# Patient Record
Sex: Female | Born: 1940 | Race: White | Hispanic: No | Smoking: Never smoker
Health system: Southern US, Community
[De-identification: ages and names within clinical notes are randomized; demographics above are authoritative.]

## PROBLEM LIST (undated history)

## (undated) DIAGNOSIS — I10 Essential (primary) hypertension: Secondary | ICD-10-CM

## (undated) DIAGNOSIS — C50911 Malignant neoplasm of unspecified site of right female breast: Secondary | ICD-10-CM

---

## 2012-12-03 DIAGNOSIS — E559 Vitamin D deficiency, unspecified: Secondary | ICD-10-CM | POA: Insufficient documentation

## 2013-06-24 DIAGNOSIS — E039 Hypothyroidism, unspecified: Secondary | ICD-10-CM | POA: Insufficient documentation

## 2014-06-15 DIAGNOSIS — H2510 Age-related nuclear cataract, unspecified eye: Secondary | ICD-10-CM | POA: Insufficient documentation

## 2016-10-20 DIAGNOSIS — R7309 Other abnormal glucose: Secondary | ICD-10-CM | POA: Insufficient documentation

## 2016-10-20 DIAGNOSIS — K529 Noninfective gastroenteritis and colitis, unspecified: Secondary | ICD-10-CM | POA: Insufficient documentation

## 2016-10-20 DIAGNOSIS — M353 Polymyalgia rheumatica: Secondary | ICD-10-CM | POA: Insufficient documentation

## 2016-10-20 DIAGNOSIS — E785 Hyperlipidemia, unspecified: Secondary | ICD-10-CM | POA: Insufficient documentation

## 2016-10-20 DIAGNOSIS — M8580 Other specified disorders of bone density and structure, unspecified site: Secondary | ICD-10-CM | POA: Insufficient documentation

## 2017-06-27 DIAGNOSIS — G8929 Other chronic pain: Secondary | ICD-10-CM | POA: Insufficient documentation

## 2018-06-27 DIAGNOSIS — M25551 Pain in right hip: Secondary | ICD-10-CM | POA: Insufficient documentation

## 2018-07-24 DIAGNOSIS — M19071 Primary osteoarthritis, right ankle and foot: Secondary | ICD-10-CM | POA: Insufficient documentation

## 2018-09-16 DIAGNOSIS — I4891 Unspecified atrial fibrillation: Secondary | ICD-10-CM | POA: Insufficient documentation

## 2018-09-24 DIAGNOSIS — K219 Gastro-esophageal reflux disease without esophagitis: Secondary | ICD-10-CM | POA: Insufficient documentation

## 2018-09-24 DIAGNOSIS — Z853 Personal history of malignant neoplasm of breast: Secondary | ICD-10-CM | POA: Insufficient documentation

## 2018-09-24 DIAGNOSIS — Z9013 Acquired absence of bilateral breasts and nipples: Secondary | ICD-10-CM | POA: Insufficient documentation

## 2018-09-24 DIAGNOSIS — I7121 Aneurysm of the ascending aorta, without rupture: Secondary | ICD-10-CM | POA: Insufficient documentation

## 2019-04-14 DIAGNOSIS — R002 Palpitations: Secondary | ICD-10-CM | POA: Insufficient documentation

## 2019-04-16 DIAGNOSIS — L570 Actinic keratosis: Secondary | ICD-10-CM | POA: Insufficient documentation

## 2019-04-16 DIAGNOSIS — L821 Other seborrheic keratosis: Secondary | ICD-10-CM | POA: Insufficient documentation

## 2019-04-16 DIAGNOSIS — D225 Melanocytic nevi of trunk: Secondary | ICD-10-CM | POA: Insufficient documentation

## 2019-06-27 DIAGNOSIS — H02539 Eyelid retraction unspecified eye, unspecified lid: Secondary | ICD-10-CM | POA: Insufficient documentation

## 2019-08-12 DIAGNOSIS — H02409 Unspecified ptosis of unspecified eyelid: Secondary | ICD-10-CM | POA: Insufficient documentation

## 2019-08-12 DIAGNOSIS — M6289 Other specified disorders of muscle: Secondary | ICD-10-CM | POA: Insufficient documentation

## 2019-11-29 DIAGNOSIS — Z7901 Long term (current) use of anticoagulants: Secondary | ICD-10-CM | POA: Insufficient documentation

## 2020-01-27 DIAGNOSIS — R911 Solitary pulmonary nodule: Secondary | ICD-10-CM | POA: Insufficient documentation

## 2020-01-27 DIAGNOSIS — L719 Rosacea, unspecified: Secondary | ICD-10-CM | POA: Insufficient documentation

## 2020-01-27 DIAGNOSIS — M8580 Other specified disorders of bone density and structure, unspecified site: Secondary | ICD-10-CM | POA: Insufficient documentation

## 2020-01-28 DIAGNOSIS — Z789 Other specified health status: Secondary | ICD-10-CM | POA: Insufficient documentation

## 2020-06-14 ENCOUNTER — Other Ambulatory Visit: Payer: Self-pay | Admitting: Internal Medicine

## 2020-06-14 DIAGNOSIS — Z880 Allergy status to penicillin: Secondary | ICD-10-CM | POA: Insufficient documentation

## 2020-06-14 DIAGNOSIS — M629 Disorder of muscle, unspecified: Secondary | ICD-10-CM | POA: Insufficient documentation

## 2020-06-14 DIAGNOSIS — I712 Thoracic aortic aneurysm, without rupture, unspecified: Secondary | ICD-10-CM

## 2020-06-14 DIAGNOSIS — S92309A Fracture of unspecified metatarsal bone(s), unspecified foot, initial encounter for closed fracture: Secondary | ICD-10-CM | POA: Insufficient documentation

## 2020-06-14 DIAGNOSIS — R531 Weakness: Secondary | ICD-10-CM | POA: Insufficient documentation

## 2020-06-14 DIAGNOSIS — M1812 Unilateral primary osteoarthritis of first carpometacarpal joint, left hand: Secondary | ICD-10-CM | POA: Insufficient documentation

## 2020-07-15 ENCOUNTER — Other Ambulatory Visit: Payer: Self-pay

## 2020-07-15 ENCOUNTER — Ambulatory Visit
Admission: RE | Admit: 2020-07-15 | Discharge: 2020-07-15 | Disposition: A | Discharge status: Home / Self Care | Payer: Medicare HMO | Source: Ambulatory Visit | Attending: Internal Medicine | Admitting: Internal Medicine

## 2020-07-15 DIAGNOSIS — I712 Thoracic aortic aneurysm, without rupture, unspecified: Secondary | ICD-10-CM

## 2020-07-15 HISTORY — DX: Essential (primary) hypertension: I10

## 2020-07-15 HISTORY — DX: Malignant neoplasm of unspecified site of right female breast: C50.911

## 2020-07-15 LAB — POCT I-STAT CREATININE: Creatinine, Ser: 1 mg/dL (ref 0.44–1.00)

## 2020-07-15 IMAGING — CT CT ANGIO CHEST
2 of 6 series · 13 of 36 positions shown · IV contrast (omnipaque)
Comparison: None.

CLINICAL DATA: Chest and back pain for 6 days. History of thoracic
aortic aneurysm.

EXAM:
CT ANGIOGRAPHY CHEST WITH CONTRAST
TECHNIQUE: Multidetector CT imaging of the chest was performed using the
standard protocol during bolus administration of intravenous
contrast. Multiplanar CT image reconstructions and MIPs were
obtained to evaluate the vascular anatomy.
CONTRAST:  75mL OMNIPAQUE IOHEXOL 350 MG/ML SOLN

[Series 7: axial arterial cta thorax 2.00 · axial · arterial · 0.63mm/px · z∈[+1637,+1907]mm · 12 of 161 slices shown]
[im 13/161  lung]
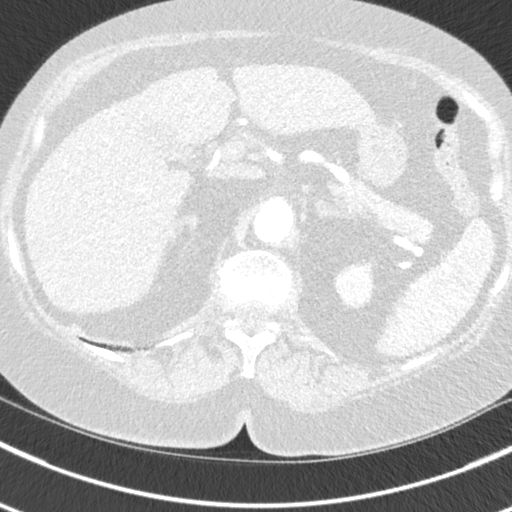
[im 25/161  mediastinal]
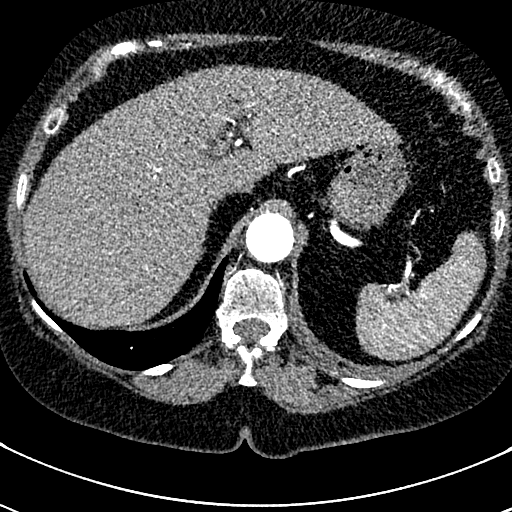
[im 37/161  lung]
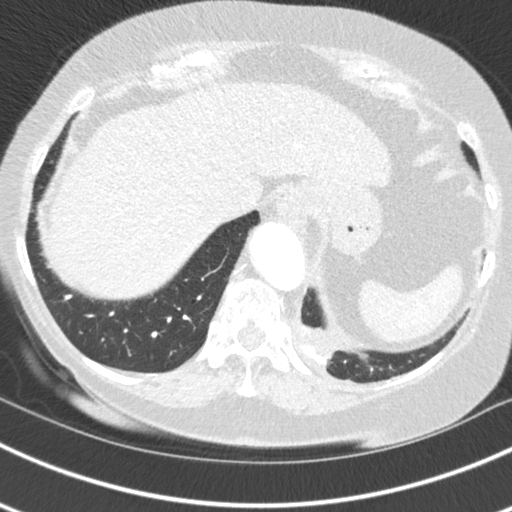
[im 50/161  mediastinal]
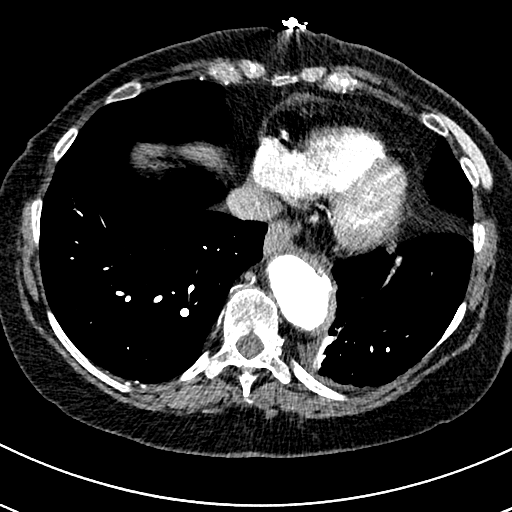
[im 62/161  lung]
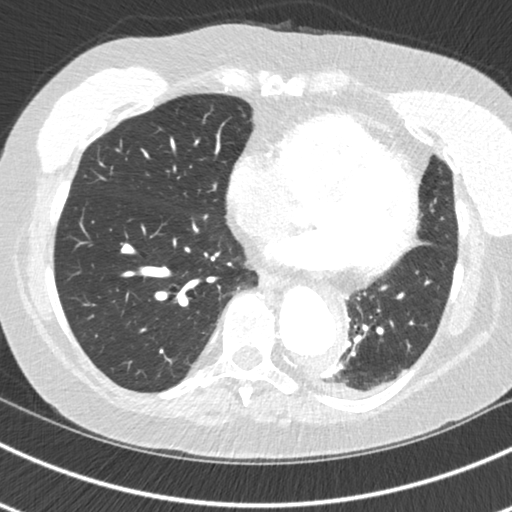
[im 74/161  mediastinal]
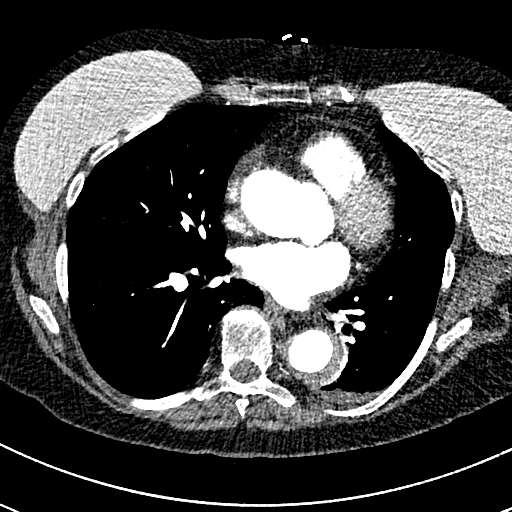
[im 87/161  lung]
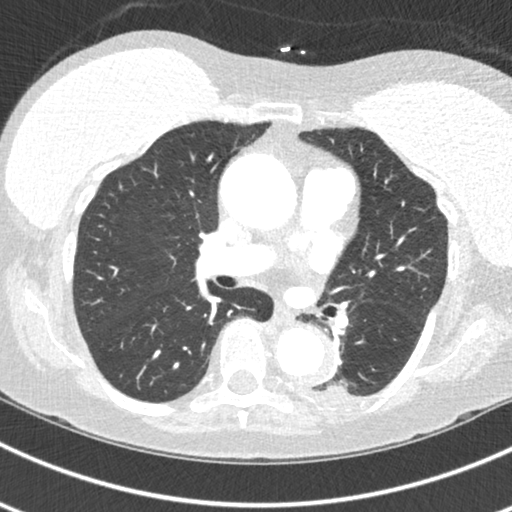
[im 99/161  mediastinal]
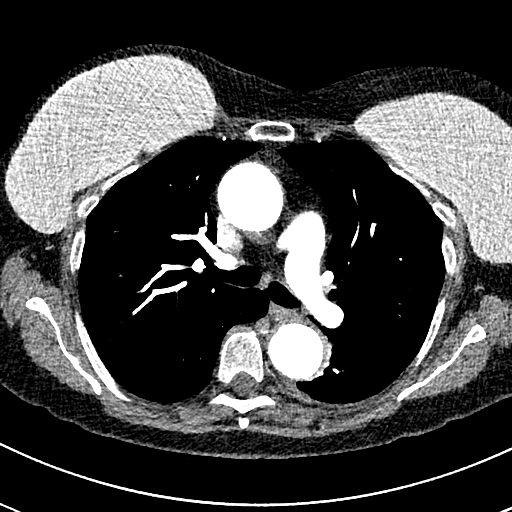
[im 111/161  lung]
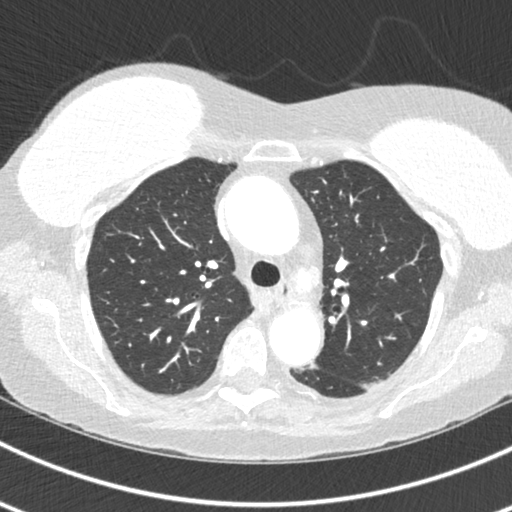
[im 124/161  mediastinal]
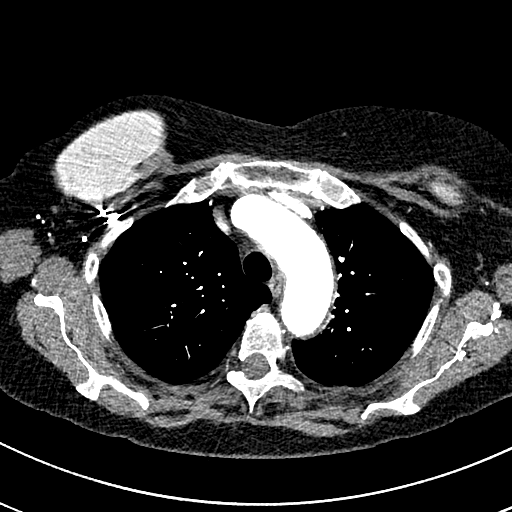
[im 136/161  lung]
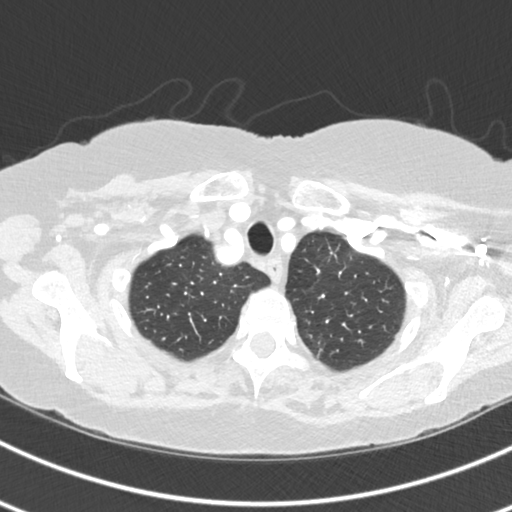
[im 148/161  mediastinal]
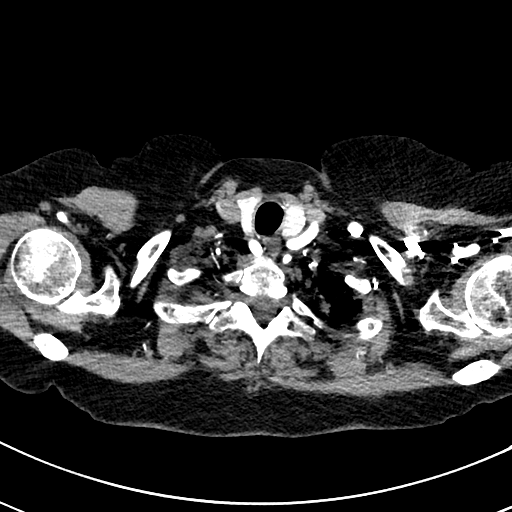

[Series 10: cor st cta thorax 2.00 cor · coronal · 0.63mm/px · 1 of 138 slices shown]
[im 69/138  mediastinal]
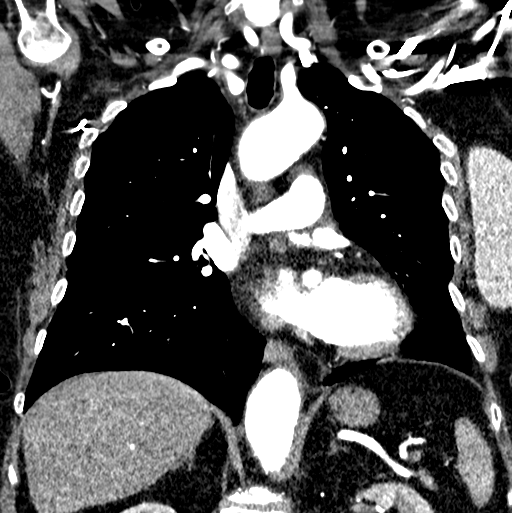

[13 of 36 positions shown; findings below may reference images not displayed]

FINDINGS: Cardiovascular: The heart is normal in size. No pericardial
effusion. There is moderate tortuosity, ectasia and scattered
calcification involving the thoracic aorta. Fusiform aneurysmal
dilatation of the ascending thoracic aorta with maximum diameter of
4.3 cm. No dissection. The branch vessels are patent. No definite
coronary artery calcifications.

Aneurysmal dilatation of the descending thoracic aorta and moderate
tortuosity. Maximum diameter is 3.2 cm. There is a thick rim of high
attenuation material around the aortic lumen. This is consistent
with an intramural hematoma.

A small penetrating ulcer noted involving the distal descending
thoracic aorta (image 110/7).

Small focus of contrast noted in the posterior aortic wall on image
94-98 is likely active intramural hematoma. Second smaller similar
area of hemorrhage noted on image 58.

Mediastinum/Nodes: No mediastinal or hilar mass or adenopathy. The
esophagus is grossly normal.

Lungs/Pleura: Emphysematous changes and some areas of pulmonary
scarring. There is a small left pleural effusion and overlying
atelectasis. No worrisome pulmonary lesions

Upper Abdomen: No significant upper abdominal findings. The upper
abdominal aorta is unremarkable except for scattered vascular
calcifications.

Musculoskeletal: No significant bony findings. Thoracic scoliosis
noted. Bilateral breast prostheses.

Review of the MIP images confirms the above findings.
IMPRESSION: 1. Fusiform aneurysmal dilatation of the ascending thoracic aorta
with maximum diameter of 4.3 cm. No dissection.
2. CT findings consistent with acute aortic syndrome. Intramural
hematoma involving the descending thoracic aorta distally. Evidence
of a penetrating ulcer and 2 small areas of active hemorrhage in the
wall of the aorta. No retrograde progression into the ascending
thoracic aorta. Recommend vascular consultation.
3. Small left pleural effusion and overlying atelectasis.
4. Emphysematous changes and pulmonary scarring. No worrisome
pulmonary lesions.
5. Emphysema and aortic atherosclerosis.

Attempting to call the patient's physician but the office is closed.
Trying to get in touch with the on call physician. If this is
unsuccessful I will call the patient directly and have for go to the
emergency room.

Aortic aneurysm NOS ([9P]-[9P]).

## 2020-07-15 MED ORDER — IOHEXOL 350 MG/ML SOLN
75.0000 mL | Freq: Once | INTRAVENOUS | Status: AC | PRN
  Administered 2020-07-15: 75 mL via INTRAVENOUS

## 2020-07-21 DIAGNOSIS — I7100 Dissection of unspecified site of aorta: Secondary | ICD-10-CM | POA: Insufficient documentation

## 2020-07-22 DIAGNOSIS — Z95828 Presence of other vascular implants and grafts: Secondary | ICD-10-CM | POA: Insufficient documentation

## 2020-08-06 DIAGNOSIS — Z8679 Personal history of other diseases of the circulatory system: Secondary | ICD-10-CM | POA: Insufficient documentation

## 2021-01-26 ENCOUNTER — Ambulatory Visit: Admission: RE | Admit: 2021-01-26 | Payer: Medicare HMO | Source: Ambulatory Visit

## 2021-01-26 ENCOUNTER — Other Ambulatory Visit: Payer: Self-pay | Admitting: Internal Medicine

## 2021-01-26 ENCOUNTER — Other Ambulatory Visit (HOSPITAL_COMMUNITY): Payer: Self-pay | Admitting: Internal Medicine

## 2021-01-26 DIAGNOSIS — I71019 Dissection of thoracic aorta, unspecified: Secondary | ICD-10-CM

## 2021-01-26 DIAGNOSIS — R071 Chest pain on breathing: Secondary | ICD-10-CM

## 2021-01-26 DIAGNOSIS — I7101 Dissection of thoracic aorta: Secondary | ICD-10-CM

## 2021-01-27 ENCOUNTER — Ambulatory Visit
Admission: RE | Admit: 2021-01-27 | Discharge: 2021-01-27 | Disposition: A | Discharge status: Home / Self Care | Payer: Medicare HMO | Source: Ambulatory Visit | Attending: Internal Medicine | Admitting: Internal Medicine

## 2021-01-27 ENCOUNTER — Other Ambulatory Visit: Payer: Self-pay

## 2021-01-27 DIAGNOSIS — I7101 Dissection of thoracic aorta: Secondary | ICD-10-CM | POA: Diagnosis present

## 2021-01-27 DIAGNOSIS — R071 Chest pain on breathing: Secondary | ICD-10-CM

## 2021-01-27 DIAGNOSIS — I71019 Dissection of thoracic aorta, unspecified: Secondary | ICD-10-CM

## 2021-01-27 MED ORDER — IOHEXOL 350 MG/ML SOLN
100.0000 mL | Freq: Once | INTRAVENOUS | Status: AC | PRN
  Administered 2021-01-27: 100 mL via INTRAVENOUS

## 2021-08-16 NOTE — Progress Notes (Signed)
Electrophysiology Office Note:    Date:  08/17/2021   ID:  Teresa Moore. [name], [name] 08/14/1941, MRN [id number]  PCP:  [name]:  None  [facility] Electrophysiologist:  None   Referring MD: [name], MD   Chief Complaint: Atrial fibrillation  History of Present Illness:    Teresa Moore. Paz is a 80 y.o. female who presents for an evaluation of atrial fibrillation at the request of Dr. [name]. Their medical history includes hypertension and breast cancer and thoracic aortic aneurysm post TEVAR in October 2021.  The patient last saw Dr. [name] June 14, 2021.  At that appointment she reported asymptomatic atrial fibrillation alerts from her watch.  The patient was seen by Dr. Durward Mallard at [facility] on July 18, 2021 for her history of aortic disease.  She is post TEVAR on July 21, 2020 for an acute type B intramural hematoma.  At that appointment she was reported to have more atrial fibrillation.  Today she tells me that she is only taking Eliquis once a day.  In the past when she took it twice a day she would experience nosebleeding.  She tells me that she rarely experiences palpitations with her atrial fibrillation.  Occasionally her watch will alert for high heart rate and she will feel palpitations.  No syncope or presyncope.  She is fairly active.   Past Medical History:  Diagnosis Date   Bilateral breast cancer ([place])    Mastectomies 35 years ago.    Hypertension     History reviewed. No pertinent surgical history.  Current Medications: Current Meds  Medication Sig   Cholecalciferol 25 MCG (1000 UT) capsule Vitamin D3 25 mcg (1,000 unit) capsule  Take by oral route.   dicyclomine (BENTYL) 20 MG tablet Take 20 mg by mouth 3 (three) times daily as needed.   escitalopram (LEXAPRO) 10 MG tablet Take 10 mg by mouth daily.   hydrochlorothiazide (HYDRODIURIL) 12.5 MG tablet Take 12.5 mg by mouth daily.   levothyroxine  (SYNTHROID) 50 MCG tablet Take 50 mcg by mouth daily.   methylPREDNISolone (MEDROL) 4 MG tablet Take 4 mg by mouth daily as needed.   metoprolol succinate (TOPROL-XL) 50 MG 24 hr tablet Take 50 mg by mouth daily.   pantoprazole (PROTONIX) 40 MG tablet Take 40 mg by mouth 2 (two) times daily.   rivaroxaban (XARELTO) 20 MG TABS tablet Take 1 tablet (20 mg total) by mouth daily with supper. New start Xarelto.   [DISCONTINUED] ELIQUIS 5 MG TABS tablet Take 5 mg by mouth 2 (two) times daily.     Allergies:   Celebrex [celecoxib], Claritin-d 24 hour [loratadine-pseudoephedrine er], and Penicillins   Social History   Socioeconomic History   Marital status: Divorced    Spouse name: Not on file   Number of children: Not on file   Years of education: Not on file   Highest education level: Not on file  Occupational History   Not on file  Tobacco Use   Smoking status: Never   Smokeless tobacco: Never  Substance and Sexual Activity   Alcohol use: Yes   Drug use: Never   Sexual activity: Not on file  Other Topics Concern   Not on file  Social History Narrative   Not on file   Social Determinants of Health   Financial Resource Strain: Not on file  Food Insecurity: Not on file  Transportation Needs: Not on file  Physical Activity: Not on file  Stress:  Not on file  Social Connections: Not on file     Family History: The patient's family history is not on file.  ROS:   Please see the history of present illness.    All other systems reviewed and are negative.  EKGs/Labs/Other Studies Reviewed:    The following studies were reviewed today:  Transesophageal echocardiogram on July 21, 2020 at [facility] showed normal biventricular function.  Dilated ascending aorta.  EKG:  The ekg ordered today demonstrates atrial fibrillation with a slow ventricular rate at 52 beats a minute.  Recent Labs: No results found for requested labs within last 8760 hours.  Recent Lipid Panel No results  found for: CHOL, TRIG, HDL, CHOLHDL, VLDL, LDLCALC, LDLDIRECT  Physical Exam:    VS:  BP 100/64 (BP Location: Left Arm, Patient Position: Sitting, Cuff Size: Normal)   Pulse (!) 52   Ht 5\' 9"  (1.753 m)   Wt 195 lb (88.5 kg)   LMP  (LMP Unknown)   SpO2 97%   BMI 28.80 kg/m     Wt Readings from Last 3 Encounters:  08/17/21 195 lb (88.5 kg)     GEN:  Well nourished, well developed in no acute distress HEENT: Normal NECK: No JVD; No carotid bruits LYMPHATICS: No lymphadenopathy CARDIAC: Irregularly irregular, no murmurs, rubs, gallops RESPIRATORY:  Clear to auscultation without rales, wheezing or rhonchi  ABDOMEN: Soft, non-tender, non-distended MUSCULOSKELETAL:  No edema; No deformity  SKIN: Warm and dry NEUROLOGIC:  Alert and oriented x 3 PSYCHIATRIC:  Normal affect   ASSESSMENT:    1. Persistent atrial fibrillation (Teresa Moore)   2. Hypothyroidism, unspecified type   3. Primary hypertension    PLAN:    In order of problems listed above:  #Persistent atrial fibrillation Today she has a slow ventricular response in the low 50s.  She is off of her metoprolol at this time.  I would first like to get an idea of her heart rate trends.  I will start with a ZIO monitor for 7 days.  If her heart rates are generally well controlled, I would favor not starting metoprolol.  I think the most important thing for today is to get her adequately protected against stroke.  I have asked her to stop taking Eliquis and start Xarelto 20 mg by mouth once daily with food.  I will plan to touch base with her in 3 to 4 weeks after the ZIO monitor results are in so we can discuss next steps.  I will also take metoprolol off her med list.  #Hypothyroidism Continue Synthroid  #Hypertension Controlled today.  Continue current regimen.  Follow-up 3 to 4 weeks.    Medication Adjustments/Labs and Tests Ordered: Current medicines are reviewed at length with the patient today.  Concerns regarding  medicines are outlined above.  Orders Placed This Encounter  Procedures   EKG 12-Lead    Meds ordered this encounter  Medications   rivaroxaban (XARELTO) 20 MG TABS tablet    Sig: Take 1 tablet (20 mg total) by mouth daily with supper. New start Xarelto.    Dispense:  30 tablet    Refill:  0      Signed, Kayal Mula T. Quentin Ore, MD, [facility], [facility] 08/17/2021 11:37 AM    Electrophysiology [facility]

## 2021-08-17 ENCOUNTER — Ambulatory Visit (INDEPENDENT_AMBULATORY_CARE_PROVIDER_SITE_OTHER): Payer: Medicare HMO

## 2021-08-17 ENCOUNTER — Ambulatory Visit: Payer: Medicare HMO | Admitting: Cardiology

## 2021-08-17 ENCOUNTER — Encounter: Payer: Self-pay | Admitting: Cardiology

## 2021-08-17 ENCOUNTER — Other Ambulatory Visit: Payer: Self-pay

## 2021-08-17 VITALS — BP 100/64 | HR 52 | Ht 69.0 in | Wt 195.0 lb

## 2021-08-17 DIAGNOSIS — I10 Essential (primary) hypertension: Secondary | ICD-10-CM

## 2021-08-17 DIAGNOSIS — I4819 Other persistent atrial fibrillation: Secondary | ICD-10-CM

## 2021-08-17 DIAGNOSIS — E039 Hypothyroidism, unspecified: Secondary | ICD-10-CM | POA: Diagnosis not present

## 2021-08-17 MED ORDER — RIVAROXABAN 20 MG PO TABS: 20.0000 mg | ORAL_TABLET | Freq: Every day | ORAL | 0 refills | Status: DC

## 2021-08-17 MED ORDER — RIVAROXABAN 20 MG PO TABS: 20.0000 mg | ORAL_TABLET | Freq: Every day | ORAL | 3 refills | Status: DC

## 2021-08-17 NOTE — Patient Instructions (Addendum)
Medication Instructions:  Your physician has recommended you make the following change in your medication:    STOP taking Eliquis  2.  START taking Xarelto 20 mg-  Take one tablet by mouth daily with your largest meal  *If you need a refill on your cardiac medications before your next appointment, please call your pharmacy*  Lab Work: None ordered. If you have labs (blood work) drawn today and your tests are completely normal, you will receive your results only by: [place] (if you have MyChart) OR A paper copy in the mail If you have any lab test that is abnormal or we need to change your treatment, we will call you to review the results.  Testing/Procedures: Your physician has recommended that you wear a holter monitor. Holter monitors are medical devices that record the heart's electrical activity. Doctors most often use these monitors to diagnose arrhythmias. Arrhythmias are problems with the speed or rhythm of the heartbeat. The monitor is a small, portable device. You can wear one while you do your normal daily activities. This is usually used to diagnose what is causing palpitations/syncope (passing out).  You will wear a 7 day ZIO monitor  Follow-Up: At [facility], you and your health needs are our priority.  As part of our continuing mission to provide you with exceptional heart care, we have created designated Provider Care Teams.  These Care Teams include your primary Cardiologist (physician) and Advanced Practice Providers (APPs -  Physician Assistants and Nurse Practitioners) who all work together to provide you with the care you need, when you need it.  Your next appointment:   Your physician wants you to follow-up in: 3-4 weeks with [name]   Your physician has recommended that you wear a Zio monitor.   This monitor is a medical device that records the heart's electrical activity. Doctors most often use these monitors to diagnose arrhythmias. Arrhythmias are  problems with the speed or rhythm of the heartbeat. The monitor is a small device applied to your chest. You can wear one while you do your normal daily activities. While wearing this monitor if you have any symptoms to push the button and record what you felt. Once you have worn this monitor for the period of time provider prescribed (Usually 14 days), you will return the monitor device in the postage paid box. Once it is returned they will download the data collected and provide [place] with a report which the provider will then review and we will call you with those results. Important tips:  Avoid showering during the first 24 hours of wearing the monitor. Avoid excessive sweating to help maximize wear time. Do not submerge the device, no hot tubs, and no swimming pools. Keep any lotions or oils away from the patch. After 24 hours you may shower with the patch on. Take brief showers with your back facing the shower head.  Do not remove patch once it has been placed because that will interrupt data and decrease adhesive wear time. Push the button when you have any symptoms and write down what you were feeling. Once you have completed wearing your monitor, remove and place into box which has postage paid and place in your outgoing mailbox.  If for some reason you have misplaced your box then call our office and we can provide another box and/or mail it off for you.

## 2021-08-24 ENCOUNTER — Telehealth: Payer: Self-pay | Admitting: Cardiology

## 2021-08-24 NOTE — Telephone Encounter (Signed)
Pt c/o medication issue:  1. Name of Medication: rivaroxaban (XARELTO) 20 MG TABS tablet  2. How are you currently taking this medication (dosage and times per day)? As directed  3. Are you having a reaction (difficulty breathing--STAT)?   4. What is your medication issue? Patient said this medication is going to cost her too much. She would like [name] to prescribe her something that is more affordable

## 2021-08-25 NOTE — Telephone Encounter (Signed)
Returned call to Pt.  She is in the doughnut hole and cannot afford Xarelto.   She currently has a 30 day supply.  Gave phone number for [employer].  She will call and submit application.  She will bring completed documentation to office for completion.  Advised to call back if she had any further needs.

## 2021-09-14 ENCOUNTER — Ambulatory Visit: Payer: Medicare HMO | Admitting: Cardiology

## 2021-09-14 ENCOUNTER — Encounter: Payer: Self-pay | Admitting: Cardiology

## 2021-09-14 ENCOUNTER — Other Ambulatory Visit: Payer: Self-pay

## 2021-09-14 VITALS — BP 102/72 | HR 63 | Ht 69.0 in | Wt 195.0 lb

## 2021-09-14 DIAGNOSIS — I4821 Permanent atrial fibrillation: Secondary | ICD-10-CM | POA: Diagnosis not present

## 2021-09-14 DIAGNOSIS — I10 Essential (primary) hypertension: Secondary | ICD-10-CM

## 2021-09-14 MED ORDER — RIVAROXABAN 20 MG PO TABS: 20.0000 mg | ORAL_TABLET | Freq: Every day | ORAL | 11 refills | Status: DC

## 2021-09-14 NOTE — Addendum Note (Signed)
Addended by: Stanton Kidney on: 09/14/2021 11:33 AM   Modules accepted: Orders

## 2021-09-14 NOTE — Progress Notes (Signed)
Electrophysiology Office Follow up Visit Note:    Date:  09/14/2021   ID:  Teresa Moore. Teresa Moore, Teresa Moore 10-17-1941, MRN [id number]  PCP:  [place] Cardiologist:  None  [facility] Electrophysiologist:  Vickie Epley, MD    Interval History:    Teresa Moore. Teresa Moore is a 80 y.o. female who presents for a follow up visit. They were last seen in clinic August 17, 2021. Since their last appointment, she is transition to Coumadin from Xarelto because of excessive cost.  She also wore a 7-day ZIO monitor to assess her heart rate trends while in atrial fibrillation.  Heart rates ranged from 34-150 while in atrial fibrillation.  She had 100% atrial fibrillation during the monitoring period.  She had 3 pauses while wearing the ZIO monitor with the longest lasting 3.3 seconds. She has done well since I last saw her.  She is taking the Xarelto with meals but she has trouble keeping it at dinner because her meals vary between breakfast and dinner being the largest.      Past Medical History:  Diagnosis Date   Bilateral breast cancer ([place])    Mastectomies 35 years ago.    Hypertension     History reviewed. No pertinent surgical history.  Current Medications: Current Meds  Medication Sig   Cholecalciferol 25 MCG (1000 UT) capsule Vitamin D3 25 mcg (1,000 unit) capsule  Take by oral route.   dicyclomine (BENTYL) 20 MG tablet Take 20 mg by mouth 3 (three) times daily as needed.   escitalopram (LEXAPRO) 10 MG tablet Take 10 mg by mouth daily.   hydrochlorothiazide (HYDRODIURIL) 12.5 MG tablet Take 12.5 mg by mouth daily.   levothyroxine (SYNTHROID) 50 MCG tablet Take 50 mcg by mouth daily.   methylPREDNISolone (MEDROL) 4 MG tablet Take 4 mg by mouth daily as needed.   pantoprazole (PROTONIX) 40 MG tablet Take 40 mg by mouth 2 (two) times daily.   rivaroxaban (XARELTO) 20 MG TABS tablet Take 1 tablet (20 mg total) by mouth daily with supper. New start Xarelto.   [DISCONTINUED]  dicyclomine (BENTYL) 20 MG tablet Take by mouth.     Allergies:   Celebrex [celecoxib], Claritin-d 24 hour [loratadine-pseudoephedrine er], and Penicillins   Social History   Socioeconomic History   Marital status: Divorced    Spouse name: Not on file   Number of children: Not on file   Years of education: Not on file   Highest education level: Not on file  Occupational History   Not on file  Tobacco Use   Smoking status: Never   Smokeless tobacco: Never  Substance and Sexual Activity   Alcohol use: Yes   Drug use: Never   Sexual activity: Not on file  Other Topics Concern   Not on file  Social History Narrative   Not on file   Social Determinants of Health   Financial Resource Strain: Not on file  Food Insecurity: Not on file  Transportation Needs: Not on file  Physical Activity: Not on file  Stress: Not on file  Social Connections: Not on file     Family History: The patient's family history is not on file.  ROS:   Please see the history of present illness.    All other systems reviewed and are negative.  EKGs/Labs/Other Studies Reviewed:    The following studies were reviewed today:  August 31, 2021 ZIO monitor personally reviewed HR 34-150bpm, average 68bpm. 3 pauses, longest 3.3 seconds. Rare  ventricular ectopy. 100% AF. No sustained arrhythmias other than AF.     EKG:  The ekg ordered today demonstrates atrial fibrillation with a ventricular rate of 63 bpm  Recent Labs: No results found for requested labs within last 8760 hours.  Recent Lipid Panel No results found for: CHOL, TRIG, HDL, CHOLHDL, VLDL, LDLCALC, LDLDIRECT  Physical Exam:    VS:  BP 102/72 (BP Location: Left Arm, Patient Position: Sitting, Cuff Size: Normal)   Pulse 63   Ht 5\' 9"  (1.753 m)   Wt 195 lb (88.5 kg)   LMP  (LMP Unknown)   SpO2 98%   BMI 28.80 kg/m     Wt Readings from Last 3 Encounters:  09/14/21 195 lb (88.5 kg)  08/17/21 195 lb (88.5 kg)     GEN:  Well  nourished, well developed in no acute distress HEENT: Normal NECK: No JVD; No carotid bruits LYMPHATICS: No lymphadenopathy CARDIAC: Irregularly irregular, no murmurs, rubs, gallops RESPIRATORY:  Clear to auscultation without rales, wheezing or rhonchi  ABDOMEN: Soft, non-tender, non-distended MUSCULOSKELETAL:  No edema; No deformity  SKIN: Warm and dry NEUROLOGIC:  Alert and oriented x 3 PSYCHIATRIC:  Normal affect        ASSESSMENT:    1. Permanent atrial fibrillation ([place])   2. Primary hypertension    PLAN:    In order of problems listed above:  #Permanent atrial fibrillation On Xarelto for stroke prophylaxis.  We had a long discussion about how to take Xarelto during today's clinic appointment.  I recommended that she take it with breakfast each morning because it seems to be the time a day where she reliably has a meal at home and can remember to take the medication.  #Hypertension Controlled.  Continue current regimen.  Follow-up as needed with EP  Medication Adjustments/Labs and Tests Ordered: Current medicines are reviewed at length with the patient today.  Concerns regarding medicines are outlined above.  No orders of the defined types were placed in this encounter.  No orders of the defined types were placed in this encounter.    Signed, Lars Mage, MD, [facility], [facility] 09/14/2021 11:24 AM    Electrophysiology [facility]

## 2021-09-14 NOTE — Patient Instructions (Signed)
Medication Instructions:  Your physician recommends that you continue on your current medications as directed. Please refer to the Current Medication list given to you today.  *If you need a refill on your cardiac medications before your next appointment, please call your pharmacy*   Lab Work: None ordered   Testing/Procedures: None ordered   Follow-Up: At [facility], you and your health needs are our priority.  As part of our continuing mission to provide you with exceptional heart care, we have created designated Provider Care Teams.  These Care Teams include your primary Cardiologist (physician) and Advanced Practice Providers (APPs -  Physician Assistants and Nurse Practitioners) who all work together to provide you with the care you need, when you need it.  We recommend signing up for the patient portal called "MyChart".  Sign up information is provided on this After Visit Summary.  MyChart is used to connect with patients for Virtual Visits (Telemedicine).  Patients are able to view lab/test results, encounter notes, upcoming appointments, etc.  Non-urgent messages can be sent to your provider as well.   To learn more about what you can do with MyChart, go to [email or web address].    Your next appointment:   as  needed  The format for your next appointment:   In Person  Provider:   Lars Mage, MD    Thank you for choosing [facility]!!

## 2022-10-01 ENCOUNTER — Other Ambulatory Visit: Payer: Self-pay | Admitting: Cardiology

## 2022-10-02 NOTE — Telephone Encounter (Signed)
Prescription refill request for Xarelto received.  Indication:afib Last office visit:11/22 Weight:88.5  kg Age:81 Scr:0.9 CrCl:67.33  ml/min  Prescription refilled

## 2022-10-11 ENCOUNTER — Encounter: Payer: Self-pay | Admitting: Podiatry

## 2022-10-11 ENCOUNTER — Ambulatory Visit: Payer: Medicare HMO | Admitting: Podiatry

## 2022-10-11 VITALS — BP 135/85 | HR 65

## 2022-10-11 DIAGNOSIS — L03031 Cellulitis of right toe: Secondary | ICD-10-CM | POA: Diagnosis not present

## 2022-10-11 MED ORDER — NEOMYCIN-POLYMYXIN-HC 1 % OT SOLN: OTIC | 1 refills | Status: AC

## 2022-10-11 NOTE — Progress Notes (Signed)
Subjective:  Patient ID: Teresa Moore, female    DOB: [birth year],  MRN: [id number] HPI Chief Complaint  Patient presents with   Foot Injury    4th toe right - lateral border, stumped toe 2 months ago, piece of toenail came off, now area is red, swollen and very tender   New Patient (Initial Visit)    81 y.o. female presents with the above complaint.   ROS: Denies fever chills nausea vomit muscle aches pains calf pain back pain chest pain shortness of breath.  Past Medical History:  Diagnosis Date   Bilateral breast cancer ([place])    Mastectomies 35 years ago.    Hypertension    No past surgical history on file.  Current Outpatient Medications:    NEOMYCIN-POLYMYXIN-HYDROCORTISONE (CORTISPORIN) 1 % SOLN OTIC solution, Apply 1-2 drops to toe BID after soaking, Disp: 10 mL, Rfl: 1   Cholecalciferol 25 MCG (1000 UT) capsule, Vitamin D3 25 mcg (1,000 unit) capsule  Take by oral route., Disp: , Rfl:    dicyclomine (BENTYL) 20 MG tablet, Take 20 mg by mouth 3 (three) times daily as needed., Disp: , Rfl:    escitalopram (LEXAPRO) 10 MG tablet, Take 10 mg by mouth daily., Disp: , Rfl:    hydrochlorothiazide (HYDRODIURIL) 12.5 MG tablet, Take 12.5 mg by mouth daily., Disp: , Rfl:    levothyroxine (SYNTHROID) 50 MCG tablet, Take 50 mcg by mouth daily., Disp: , Rfl:    pantoprazole (PROTONIX) 40 MG tablet, Take 40 mg by mouth 2 (two) times daily., Disp: , Rfl:    XARELTO 20 MG TABS tablet, TAKE ONE TABLET BY MOUTH DAILY WITH SUPPER, Disp: 30 tablet, Rfl: 11  Allergies  Allergen Reactions   Celebrex [Celecoxib] Shortness Of Breath   Claritin-D 24 Hour [Loratadine-Pseudoephedrine Er] Shortness Of Breath   Penicillins Shortness Of Breath   Latex Rash   Alendronate Other (See Comments)   Quinolones Other (See Comments)    Fluroquinolone antibiotics should be avoided in patients with aortic disease unless alternative therapy is not an option.   Clindamycin Rash   Sulfa Antibiotics Nausea  Only   Review of Systems Objective:   Vitals:   10/11/22 1024  BP: 135/85  Pulse: 65    General: Well developed, nourished, in no acute distress, alert and oriented x3   Dermatological: Skin is warm, dry and supple bilateral. Nails x 10 are well maintained; remaining integument appears unremarkable at this time. There are no open sores, no preulcerative lesions, no rash or signs of infection present.  Sharp incurvated nail margin along the fibular border of the hallux right exquisitely tender pinpoint.  There is a portion of the nail that is missing a small scab that is still present.  Vascular: Dorsalis Pedis artery and Posterior Tibial artery pedal pulses are 2/4 bilateral with immedate capillary fill time. Pedal hair growth present. No varicosities and no lower extremity edema present bilateral.   Neruologic: Grossly intact via light touch bilateral. Vibratory intact via tuning fork bilateral. Protective threshold with Semmes Wienstein monofilament intact to all pedal sites bilateral. Patellar and Achilles deep tendon reflexes 2+ bilateral. No Babinski or clonus noted bilateral.   Musculoskeletal: No gross boney pedal deformities bilateral. No pain, crepitus, or limitation noted with foot and ankle range of motion bilateral. Muscular strength 5/5 in all groups tested bilateral.  Gait: Unassisted, Nonantalgic.    Radiographs:  None taken  Assessment & Plan:   Assessment: Ingrown toenail fibular border fourth digit right foot  Plan: I&D with partial nail avulsion lateral border.  Tolerated procedure well after local anesthetic was administered.  No complications was given both oral and home-going structures care and soaking of the toe follow-up with her in a couple weeks to make sure she is healing well.     Dymon Summerhill T. [place], [place]

## 2022-10-11 NOTE — Patient Instructions (Addendum)

## 2022-11-14 ENCOUNTER — Encounter: Payer: Self-pay | Admitting: Podiatry

## 2022-11-14 ENCOUNTER — Ambulatory Visit: Payer: Medicare HMO | Admitting: Podiatry

## 2022-11-14 DIAGNOSIS — L03031 Cellulitis of right toe: Secondary | ICD-10-CM | POA: Diagnosis not present

## 2022-11-14 NOTE — Progress Notes (Signed)
She presents today date of surgery for her fourth toe right foot was 10/11/2022 she is was a lateral matrixectomy fibular border.  States that is still tender and has not healed all the way yet.  She continues to soak.  Objective: Vital signs are stable alert and oriented x 3 there is no erythema edema cellulitis drainage or odor she still has a very small mild open lesion proximal margin does not appear to be draining no purulence no cellulitic process.  Assessment: Slow healing wound matricectomy fourth digit right foot most likely due to the rotation and the ambulation which results in her chronic pressure on the surgical site.  Plan: Encouraged her to continue to soak Epsom salts and warm water until this is completely healed.  Covered in the day leave open at bedtime continue the use of Cortisporin Otic as directed.

## 2022-12-04 ENCOUNTER — Other Ambulatory Visit: Payer: Self-pay | Admitting: Family Medicine

## 2022-12-04 DIAGNOSIS — R42 Dizziness and giddiness: Secondary | ICD-10-CM

## 2022-12-04 DIAGNOSIS — I480 Paroxysmal atrial fibrillation: Secondary | ICD-10-CM

## 2022-12-04 DIAGNOSIS — R002 Palpitations: Secondary | ICD-10-CM

## 2023-01-10 ENCOUNTER — Ambulatory Visit: Payer: Medicare HMO | Admitting: Cardiology

## 2023-10-12 ENCOUNTER — Other Ambulatory Visit: Payer: Self-pay | Admitting: Cardiology

## 2023-10-12 NOTE — Telephone Encounter (Addendum)
Xarelto 20mg  refill request received. Pt is 82 years old, weight-88.5kg, Crea-0.90 on 07/20/23 via [facility], last seen by Dr. Lalla Brothers on 09/14/2021 and per Care Everywhere has been at [facility] on 09/26/23, Diagnosis-Afib, CrCl-77.85 mL/min; Dose is appropriate based on dosing criteria.   Pt has been going to [facility] and called pt regarding this. Will await a call back.  Pt called back and stated she has been going to [facility] for the last couple years and will call them to have it refilled and was thankful for the call. Will deny refill since she will not be seeing out Cardiologist.

## 2024-11-02 ENCOUNTER — Emergency Department

## 2024-11-02 ENCOUNTER — Ambulatory Visit: Admission: EM | Admit: 2024-11-02 | Discharge: 2024-11-02 | Disposition: A | Discharge status: Home / Self Care

## 2024-11-02 ENCOUNTER — Emergency Department
Admission: EM | Admit: 2024-11-02 | Discharge: 2024-11-02 | Disposition: A | Discharge status: Home / Self Care | Attending: Emergency Medicine | Admitting: Emergency Medicine

## 2024-11-02 ENCOUNTER — Other Ambulatory Visit: Payer: Self-pay

## 2024-11-02 DIAGNOSIS — J101 Influenza due to other identified influenza virus with other respiratory manifestations: Secondary | ICD-10-CM | POA: Diagnosis not present

## 2024-11-02 DIAGNOSIS — R112 Nausea with vomiting, unspecified: Secondary | ICD-10-CM | POA: Diagnosis not present

## 2024-11-02 DIAGNOSIS — R0602 Shortness of breath: Secondary | ICD-10-CM | POA: Diagnosis not present

## 2024-11-02 DIAGNOSIS — R531 Weakness: Secondary | ICD-10-CM | POA: Diagnosis not present

## 2024-11-02 DIAGNOSIS — R7981 Abnormal blood-gas level: Secondary | ICD-10-CM

## 2024-11-02 DIAGNOSIS — Z853 Personal history of malignant neoplasm of breast: Secondary | ICD-10-CM | POA: Diagnosis not present

## 2024-11-02 DIAGNOSIS — I10 Essential (primary) hypertension: Secondary | ICD-10-CM | POA: Insufficient documentation

## 2024-11-02 DIAGNOSIS — R051 Acute cough: Secondary | ICD-10-CM | POA: Diagnosis not present

## 2024-11-02 DIAGNOSIS — R059 Cough, unspecified: Secondary | ICD-10-CM | POA: Diagnosis present

## 2024-11-02 LAB — CBC
HCT: 46.5 % — ABNORMAL HIGH (ref 36.0–46.0)
Hemoglobin: 14.7 g/dL (ref 12.0–15.0)
MCH: 30.1 pg (ref 26.0–34.0)
MCHC: 31.6 g/dL (ref 30.0–36.0)
MCV: 95.3 fL (ref 80.0–100.0)
Platelets: 200 K/uL (ref 150–400)
RBC: 4.88 MIL/uL (ref 3.87–5.11)
RDW: 14.6 % (ref 11.5–15.5)
WBC: 6.6 K/uL (ref 4.0–10.5)
nRBC: 0 % (ref 0.0–0.2)

## 2024-11-02 LAB — RESP PANEL BY RT-PCR (RSV, FLU A&B, COVID)  RVPGX2
Influenza A by PCR: POSITIVE — AB
Influenza B by PCR: NEGATIVE
Resp Syncytial Virus by PCR: NEGATIVE
SARS Coronavirus 2 by RT PCR: NEGATIVE

## 2024-11-02 LAB — BASIC METABOLIC PANEL WITH GFR
Anion gap: 14 (ref 5–15)
BUN: 14 mg/dL (ref 8–23)
CO2: 26 mmol/L (ref 22–32)
Calcium: 9.1 mg/dL (ref 8.9–10.3)
Chloride: 101 mmol/L (ref 98–111)
Creatinine, Ser: 0.94 mg/dL (ref 0.44–1.00)
GFR, Estimated: 59 mL/min — ABNORMAL LOW
Glucose, Bld: 107 mg/dL — ABNORMAL HIGH (ref 70–99)
Potassium: 3.5 mmol/L (ref 3.5–5.1)
Sodium: 141 mmol/L (ref 135–145)

## 2024-11-02 MED ORDER — GUAIFENESIN-CODEINE 100-10 MG/5ML PO SOLN: 5.0000 mL | Freq: Four times a day (QID) | ORAL | 0 refills | Status: AC | PRN

## 2024-11-02 MED ORDER — GUAIFENESIN 100 MG/5ML PO LIQD: 5.0000 mL | Freq: Four times a day (QID) | ORAL | Status: DC | PRN

## 2024-11-02 NOTE — Discharge Instructions (Signed)
 Go to the emergency department for evaluation of your shortness of breath, vomiting, and other symptoms.

## 2024-11-02 NOTE — ED Provider Notes (Signed)
 "  [facility] Provider Note    Event Date/Time   First MD Initiated Contact with Patient 11/02/24 1720     (approximate)   History   Cough   HPI  Teresa Moore is a 83 y.o. female with history of osteoarthritis, hypertension, breast CA and as listed in EMR presents to the emergency department for treatment and evaluation of cough, congestion, shortness of breath, weakness, and vomiting.  Symptoms started 2 days ago.SABRA     Physical Exam    Vitals:   11/02/24 1516  BP: 130/79  Pulse: 87  Resp: 20  Temp: 98.3 F (36.8 C)  SpO2: 95%    General: Awake, no distress.  CV:  Good peripheral perfusion.  Resp:  Normal effort. Breath sounds clear to auscultation. Abd:  No distention.  Other:  Persistent, nonproductive cough observed.   ED Results / Procedures / Treatments   Labs (all labs ordered are listed, but only abnormal results are displayed)  Labs Reviewed  RESP PANEL BY RT-PCR (RSV, FLU A&B, COVID)  RVPGX2 - Abnormal; Notable for the following components:      Result Value   Influenza A by PCR POSITIVE (*)    All other components within normal limits  BASIC METABOLIC PANEL WITH GFR - Abnormal; Notable for the following components:   Glucose, Bld 107 (*)    GFR, Estimated 59 (*)    All other components within normal limits  CBC - Abnormal; Notable for the following components:   HCT 46.5 (*)    All other components within normal limits     EKG  NSR rate of 95   RADIOLOGY  Image and radiology report reviewed and interpreted by me. Radiology report consistent with the same.  Chest x-ray negative for acute cardiopulmonary abnormality.  PROCEDURES:  Critical Care performed: No  Procedures   MEDICATIONS ORDERED IN ED:  Medications  guaiFENesin  (ROBITUSSIN) 100 MG/5ML liquid 5 mL (has no administration in time range)     IMPRESSION / MDM / ASSESSMENT AND PLAN / ED COURSE   I have reviewed the triage note and vital  signs. Vital signs are stable   Differential diagnosis includes, but is not limited to, COVID, influenza, pneumonia, PE  Patient's presentation is most consistent with acute illness / injury with system symptoms.  83 year old active female presents to the emergency department for treatment and evaluation of cough with congestion and diffuse bodyaches for the past 2 days.  See HPI for further details.  Exam was overall reassuring.  CBC is normal.  BMP shows a GFR of 59 but otherwise normal.  Respiratory panel shows a influenza a positive test.  Chest x-ray is negative for acute cardiopulmonary abnormality.  EKG shows a normal sinus rhythm without ectopy.  All results discussed with the patient.  Plan will be to treat her cough with Robitussin AC and have her follow-up with her primary care provider if she is not improving over the next few days.  ER return precautions also given.      FINAL CLINICAL IMPRESSION(S) / ED DIAGNOSES   Final diagnoses:  Influenza A     Rx / DC Orders   ED Discharge Orders          Ordered    guaiFENesin -codeine  100-10 MG/5ML syrup  Every 6 hours PRN        11/02/24 1818             Note:  This document was prepared using  Dragon chemical engineer and may include unintentional dictation errors.   [name], FNP 11/02/24 2327    Bradler, Evan K, MD 11/08/24 0715  "

## 2024-11-02 NOTE — ED Notes (Signed)
 Patient is being discharged from the Urgent Care and sent to the Emergency Department via POV . Per Burnard Cork NP, patient is in need of higher level of care due to [facility]. Patient is aware and verbalizes understanding of plan of care.  Vitals:   11/02/24 1449 11/02/24 1451  BP:  (!) 130/90  Pulse: 90   Resp: (!) 22   Temp:  98.7 F (37.1 C)  SpO2: 92%

## 2024-11-02 NOTE — ED Triage Notes (Signed)
 Pt comes with c/o cough congestion sob and weakness. Pt states vomiting also. Pt states this started on Friday.

## 2024-11-02 NOTE — ED Triage Notes (Addendum)
 Patient to Urgent Care with complaints of shortness of breath/ non-productive cough/ emesis/ weakness.  Symptoms started Friday.   RA sat 89-92%.

## 2024-11-02 NOTE — Discharge Instructions (Signed)
 Please call and schedule follow-up appointment with your primary care provider if you are not improving over the next several days.  Return to the emergency department for symptoms change or worsen if you are unable to schedule an appointment.

## 2024-11-02 NOTE — ED Provider Notes (Signed)
 " [facility]    CSN: [id number] Arrival date & time: 11/02/24  1436      History   Chief Complaint Chief Complaint  Patient presents with   Shortness of Breath    HPI Teresa Ruest. Moore is a 83 y.o. female.  Patient presents with 2-day history of congestion, cough, shortness of breath, vomiting, generalized weakness.  No fever, chest pain, diarrhea.  Patient reports multiple episodes of emesis during the night and that her cough is keeping her awake.  Her medical history includes aortic aneurysm, hypertension, hyperlipidemia, hypothyroidism.  The history is provided by the patient and medical records.    Past Medical History:  Diagnosis Date   Bilateral breast cancer (H[facility])    Mastectomies 35 years ago.    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 06/14/2020   Statin intolerance 01/28/2020   Other specified disorders of bone density and structure, unspecified site 01/27/2020   Rosacea 01/27/2020   Solitary pulmonary nodule 01/27/2020   Chronic anticoagulation 11/29/2019   Proximal weakness of limb 08/12/2019   Ptosis 08/12/2019   Lid lag 06/27/2019   Actinic keratosis 04/16/2019   Melanocytic nevus of trunk 04/16/2019   Raised seborrheic keratosis 04/16/2019   Palpitations 04/14/2019   Ascending aortic aneurysm 09/24/2018   Gastro-esophageal reflux disease without esophagitis 09/24/2018   History of breast cancer 09/24/2018   Status post bilateral mastectomy 09/24/2018   Unspecified atrial fibrillation (HCC) 09/16/2018   Primary osteoarthritis, right ankle and  foot 07/24/2018   Pain in right hip 06/27/2018   Chronic pain of right knee 06/27/2017   Elevated glycosylated hemoglobin 10/20/2016   Hyperlipidemia, unspecified 10/20/2016   Noninfective gastroenteritis and colitis, unspecified 10/20/2016   Osteopenia 10/20/2016   Other abnormal glucose 10/20/2016   Polymyalgia rheumatica 10/20/2016   Senile nuclear sclerosis 06/15/2014   Hypothyroidism, unspecified 06/24/2013   Vitamin D deficiency, unspecified 12/03/2012    History reviewed. No pertinent surgical history.  OB History   No obstetric history on file.      Home Medications    Prior to Admission medications  Medication Sig Start Date End Date Taking? Authorizing Provider  Cholecalciferol 25 MCG (1000 UT) capsule Vitamin D3 25 mcg (1,000 unit) capsule  Take by oral route.    [provider]  dicyclomine (BENTYL) 20 MG tablet Take 20 mg by mouth 3 (three) times daily as needed. 06/14/21   [provider]  escitalopram (LEXAPRO) 10 MG tablet Take 10 mg by mouth daily. 05/24/21   [provider]  hydrochlorothiazide (HYDRODIURIL) 12.5 MG tablet Take 12.5 mg by mouth daily. 05/24/21   [provider]  LAGEVRIO 200 MG CAPS capsule Take by mouth. 10/17/22   [provider]  levothyroxine (SYNTHROID) 50 MCG tablet Take 50 mcg by mouth daily. 06/14/21   [provider]  NEOMYCIN -POLYMYXIN-HYDROCORTISONE (CORTISPORIN) 1 % SOLN OTIC solution Apply 1-2 drops to toe BID after soaking 10/11/22   Hyatt, Max T, DPM  pantoprazole (PROTONIX) 40 MG tablet Take 40 mg by mouth 2 (two) times daily. 05/08/21   [provider]  XARELTO  20  MG TABS tablet TAKE ONE TABLET BY MOUTH DAILY WITH SUPPER 10/02/22   Cindie Ole DASEN, MD    Family History History reviewed. No pertinent family history.  Social History Social History[1]   Allergies   Celebrex [celecoxib], Claritin-d 24 hour [loratadine-pseudoephedrine er], Penicillins, Latex,  Alendronate, Quinolones, Clindamycin, and Sulfa antibiotics   Review of Systems Review of Systems  Constitutional:  Negative for chills and fever.  HENT:  Positive for congestion. Negative for ear pain and sore throat.   Respiratory:  Positive for cough and shortness of breath.   Gastrointestinal:  Positive for nausea and vomiting. Negative for diarrhea.     Physical Exam Triage Vital Signs ED Triage Vitals  Encounter Vitals Group     BP      Girls Systolic BP Percentile      Girls Diastolic BP Percentile      Boys Systolic BP Percentile      Boys Diastolic BP Percentile      Pulse      Resp      Temp      Temp src      SpO2      Weight      Height      Head Circumference      Peak Flow      Pain Score      Pain Loc      Pain Education      Exclude from Growth Chart    No data found.  Updated Vital Signs BP (!) 130/90   Pulse 90   Temp 98.7 F (37.1 C)   Resp (!) 22   LMP  (LMP Unknown)   SpO2 92%   Visual Acuity Right Eye Distance:   Left Eye Distance:   Bilateral Distance:    Right Eye Near:   Left Eye Near:    Bilateral Near:     Physical Exam Constitutional:      General: She is not in acute distress.    Appearance: She is ill-appearing.  HENT:     Mouth/Throat:     Mouth: Mucous membranes are dry.  Cardiovascular:     Rate and Rhythm: Normal rate and regular rhythm.     Heart sounds: Normal heart sounds.  Pulmonary:     Effort: Pulmonary effort is normal. No respiratory distress.     Breath sounds: Rhonchi present.  Neurological:     Mental Status: She is alert.      UC Treatments / Results  Labs (all labs ordered are listed, but only abnormal results are displayed) Labs Reviewed - No data to display  EKG   Radiology No results found.  Procedures Procedures (including critical care time)  Medications Ordered in UC Medications - No data to display  Initial Impression / Assessment and Plan / UC Course  I have reviewed the  triage vital signs and the nursing notes.  Pertinent labs & imaging results that were available during my care of the patient were reviewed by me and considered in my medical decision making (see chart for details).    Shortness of breath, nausea and vomiting, cough, generalized weakness, borderline low oxygen saturation level.  Patient's O2 sat ranging 89 to 92% on room air.  We do not have x-ray onsite available today.  Discussed limitations of evaluation of shortness of breath and persistent vomiting in an urgent care setting.  Sending her to the ED for evaluation.  She is agreeable to this.  She declines EMS  and states she feels stable to drive herself to [facility] ED.  Final Clinical Impressions(s) / UC Diagnoses   Final diagnoses:  Shortness of breath  Nausea and vomiting, unspecified vomiting type  Acute cough  Generalized weakness  Borderline low oxygen saturation level     Discharge Instructions      Go to the emergency department for evaluation of your shortness of breath, vomiting, and other symptoms.     ED Prescriptions   None    PDMP not reviewed this encounter.    [1]  Social History Tobacco Use   Smoking status: Never   Smokeless tobacco: Never  Substance Use Topics   Alcohol use: Yes   Drug use: Never     Corlis Burnard DEL, NP 11/02/24 1456  "
